# Patient Record
Sex: Male | Born: 1960 | Race: Black or African American | Hispanic: No | Marital: Single | Smoking: Former smoker
Health system: Southern US, Community
[De-identification: ages and names within clinical notes are randomized; demographics above are authoritative.]

## PROBLEM LIST (undated history)

## (undated) DIAGNOSIS — I10 Essential (primary) hypertension: Secondary | ICD-10-CM

## (undated) HISTORY — PX: KNEE SURGERY: SHX244

---

## 2013-11-08 ENCOUNTER — Other Ambulatory Visit (HOSPITAL_COMMUNITY): Payer: Self-pay | Admitting: Internal Medicine

## 2013-11-08 DIAGNOSIS — I998 Other disorder of circulatory system: Secondary | ICD-10-CM

## 2013-11-11 ENCOUNTER — Ambulatory Visit (HOSPITAL_COMMUNITY): Payer: Medicare Other

## 2013-11-15 ENCOUNTER — Ambulatory Visit (HOSPITAL_COMMUNITY): Payer: Medicare Other

## 2013-11-19 ENCOUNTER — Ambulatory Visit (HOSPITAL_COMMUNITY)
Admission: RE | Admit: 2013-11-19 | Discharge: 2013-11-19 | Disposition: A | Discharge status: Home / Self Care | Payer: Medicare Other | Source: Ambulatory Visit | Attending: Internal Medicine | Admitting: Internal Medicine

## 2013-11-19 DIAGNOSIS — M79609 Pain in unspecified limb: Secondary | ICD-10-CM

## 2013-11-19 DIAGNOSIS — I998 Other disorder of circulatory system: Secondary | ICD-10-CM | POA: Diagnosis present

## 2013-11-19 NOTE — Progress Notes (Signed)
VASCULAR LAB PRELIMINARY  PRELIMINARY  PRELIMINARY  PRELIMINARY  Left lower extremity venous duplex completed.    Preliminary report:  Left:  No evidence of DVT, superficial thrombosis, or Baker's cyst.  Johnna Bollier, RVT 11/19/2013, 12:23 PM

## 2014-04-10 ENCOUNTER — Emergency Department (HOSPITAL_COMMUNITY)
Admission: EM | Admit: 2014-04-10 | Discharge: 2014-04-10 | Disposition: A | Discharge status: Home / Self Care | Payer: Medicare Other | Attending: Emergency Medicine | Admitting: Emergency Medicine

## 2014-04-10 ENCOUNTER — Emergency Department (HOSPITAL_COMMUNITY): Payer: Medicare Other

## 2014-04-10 ENCOUNTER — Encounter (HOSPITAL_COMMUNITY): Payer: Self-pay | Admitting: Emergency Medicine

## 2014-04-10 DIAGNOSIS — R079 Chest pain, unspecified: Secondary | ICD-10-CM

## 2014-04-10 DIAGNOSIS — Z86711 Personal history of pulmonary embolism: Secondary | ICD-10-CM | POA: Insufficient documentation

## 2014-04-10 DIAGNOSIS — I10 Essential (primary) hypertension: Secondary | ICD-10-CM | POA: Diagnosis not present

## 2014-04-10 DIAGNOSIS — Z87891 Personal history of nicotine dependence: Secondary | ICD-10-CM | POA: Diagnosis not present

## 2014-04-10 DIAGNOSIS — R109 Unspecified abdominal pain: Secondary | ICD-10-CM | POA: Diagnosis present

## 2014-04-10 DIAGNOSIS — R739 Hyperglycemia, unspecified: Secondary | ICD-10-CM | POA: Insufficient documentation

## 2014-04-10 DIAGNOSIS — R2240 Localized swelling, mass and lump, unspecified lower limb: Secondary | ICD-10-CM | POA: Diagnosis not present

## 2014-04-10 DIAGNOSIS — R0602 Shortness of breath: Secondary | ICD-10-CM | POA: Insufficient documentation

## 2014-04-10 HISTORY — DX: Essential (primary) hypertension: I10

## 2014-04-10 LAB — URINALYSIS, ROUTINE W REFLEX MICROSCOPIC
Bilirubin Urine: NEGATIVE
Glucose, UA: >1000 mg/dL — AB
Hgb urine dipstick: NEGATIVE
Ketones, ur: NEGATIVE mg/dL
LEUKOCYTES UA: NEGATIVE
Nitrite: NEGATIVE
PH: 5.5 (ref 5.0–8.0)
PROTEIN: NEGATIVE mg/dL
Specific Gravity, Urine: 1.038 — ABNORMAL HIGH (ref 1.005–1.030)
Urobilinogen, UA: 0.2 mg/dL (ref 0.0–1.0)

## 2014-04-10 LAB — CBC WITH DIFFERENTIAL/PLATELET
Basophils Absolute: 0 10*3/uL (ref 0.0–0.1)
Basophils Relative: 1 % (ref 0–1)
EOS ABS: 0.1 10*3/uL (ref 0.0–0.7)
EOS PCT: 3 % (ref 0–5)
HCT: 42.4 % (ref 39.0–52.0)
Hemoglobin: 15.2 g/dL (ref 13.0–17.0)
LYMPHS ABS: 2 10*3/uL (ref 0.7–4.0)
LYMPHS PCT: 44 % (ref 12–46)
MCH: 31.9 pg (ref 26.0–34.0)
MCHC: 35.8 g/dL (ref 30.0–36.0)
MCV: 88.9 fL (ref 78.0–100.0)
Monocytes Absolute: 0.5 10*3/uL (ref 0.1–1.0)
Monocytes Relative: 11 % (ref 3–12)
NEUTROS PCT: 41 % — AB (ref 43–77)
Neutro Abs: 1.8 10*3/uL (ref 1.7–7.7)
PLATELETS: 180 10*3/uL (ref 150–400)
RBC: 4.77 MIL/uL (ref 4.22–5.81)
RDW: 13.2 % (ref 11.5–15.5)
WBC: 4.3 10*3/uL (ref 4.0–10.5)

## 2014-04-10 LAB — LIPASE, BLOOD: Lipase: 42 U/L (ref 11–59)

## 2014-04-10 LAB — COMPREHENSIVE METABOLIC PANEL
ALK PHOS: 111 U/L (ref 39–117)
ALT: 29 U/L (ref 0–53)
ANION GAP: 12 (ref 5–15)
AST: 26 U/L (ref 0–37)
Albumin: 3.6 g/dL (ref 3.5–5.2)
BILIRUBIN TOTAL: 0.9 mg/dL (ref 0.3–1.2)
BUN: 14 mg/dL (ref 6–23)
CO2: 23 mmol/L (ref 19–32)
Calcium: 8.6 mg/dL (ref 8.4–10.5)
Chloride: 98 mEq/L (ref 96–112)
Creatinine, Ser: 1.34 mg/dL (ref 0.50–1.35)
GFR calc Af Amer: 67 mL/min — ABNORMAL LOW (ref >90)
GFR calc non Af Amer: 58 mL/min — ABNORMAL LOW (ref >90)
Glucose, Bld: 498 mg/dL — ABNORMAL HIGH (ref 70–99)
Potassium: 4 mmol/L (ref 3.5–5.1)
Sodium: 133 mmol/L — ABNORMAL LOW (ref 135–145)
TOTAL PROTEIN: 6.5 g/dL (ref 6.0–8.3)

## 2014-04-10 LAB — URINE MICROSCOPIC-ADD ON

## 2014-04-10 LAB — CBG MONITORING, ED
GLUCOSE-CAPILLARY: 360 mg/dL — AB (ref 70–99)
GLUCOSE-CAPILLARY: 254 mg/dL — AB (ref 70–99)

## 2014-04-10 MED ORDER — SODIUM CHLORIDE 0.9 % IV BOLUS (SEPSIS)
1000.0000 mL | Freq: Once | INTRAVENOUS | Status: AC
  Administered 2014-04-10: 1000 mL via INTRAVENOUS

## 2014-04-10 MED ORDER — INSULIN ASPART 100 UNIT/ML ~~LOC~~ SOLN
6.0000 [IU] | Freq: Once | SUBCUTANEOUS | Status: AC
  Administered 2014-04-10: 6 [IU] via SUBCUTANEOUS
  Filled 2014-04-10: qty 1

## 2014-04-10 MED ORDER — METFORMIN HCL 500 MG PO TABS
500.0000 mg | ORAL_TABLET | Freq: Once | ORAL | Status: AC
  Administered 2014-04-10: 500 mg via ORAL
  Filled 2014-04-10: qty 1

## 2014-04-10 MED ORDER — INSULIN ASPART 100 UNIT/ML ~~LOC~~ SOLN
4.0000 [IU] | Freq: Once | SUBCUTANEOUS | Status: AC
  Administered 2014-04-10: 4 [IU] via SUBCUTANEOUS
  Filled 2014-04-10: qty 1

## 2014-04-10 MED ORDER — METFORMIN HCL 500 MG PO TABS: 500.0000 mg | ORAL_TABLET | Freq: Two times a day (BID) | ORAL | Status: AC

## 2014-04-10 MED ORDER — IOHEXOL 350 MG/ML SOLN
100.0000 mL | Freq: Once | INTRAVENOUS | Status: AC | PRN
  Administered 2014-04-10: 100 mL via INTRAVENOUS

## 2014-04-10 NOTE — ED Notes (Signed)
Pt transported to CT ?

## 2014-04-10 NOTE — ED Notes (Signed)
Patient states "I have had blood clots before and I can feel them moving around.   It hurts when I breathe in around my lung".   Patient states he is on xarelto to help treat.

## 2014-04-10 NOTE — ED Provider Notes (Signed)
CSN: [id number]     Arrival date & time 04/10/14  1010 History   First MD Initiated Contact with Patient 04/10/14 1145     Chief Complaint  Patient presents with  . Abdominal Pain     (Consider location/radiation/quality/duration/timing/severity/associated sxs/prior Treatment) HPI George Braun is a 53 y.o. male with history of PE, hypertension, presents to emergency department complaining of right sided chest pain. Patient states pain started in the right flank/chest 2 days ago. Patient states pain is exactly the same as when he was diagnosed with blood clot several years ago. He states that he has been on Coumadin, but was switched to xarelto 2 months ago. He denies missing any doses of his medications. He states that pain was not improving, so he called his doctor today, and was told to come here. Patient states he did have some shortness of breath 2 days ago, but it is improving. States that pain is improving as well. He denies any injuries. No fever, chills, cough. No other medications have taken for his symptoms. Denies abdominal pain. No nausea or vomiting. No other complaints.  Past Medical History  Diagnosis Date  . Hypertension    Past Surgical History  Procedure Laterality Date  . Knee surgery     No family history on file. History  Substance Use Topics  . Smoking status: Former Smoker    Types: Cigarettes  . Smokeless tobacco: Not on file  . Alcohol Use: Yes     Comment: socially    Review of Systems  Constitutional: Negative for fever and chills.  Respiratory: Positive for shortness of breath. Negative for cough and chest tightness.   Cardiovascular: Positive for chest pain and leg swelling. Negative for palpitations.  Gastrointestinal: Negative for nausea, vomiting, abdominal pain, diarrhea and abdominal distention.  Genitourinary: Negative for dysuria and frequency.  Musculoskeletal: Negative for myalgias, arthralgias, neck pain and neck stiffness.  Skin:  Negative for rash.  Allergic/Immunologic: Negative for immunocompromised state.  Neurological: Negative for dizziness, weakness, light-headedness, numbness and headaches.  All other systems reviewed and are negative.     Allergies  Asa and Celebrex  Home Medications   Prior to Admission medications   Not on File   BP 134/81 mmHg  Pulse 73  Temp(Src) 98.3 F (36.8 C) (Oral)  Resp 18  SpO2 98% Physical Exam  Constitutional: He appears well-developed and well-nourished. No distress.  Morbidly obese  HENT:  Head: Normocephalic and atraumatic.  Eyes: Conjunctivae are normal.  Neck: Neck supple.  Cardiovascular: Normal rate, regular rhythm and normal heart sounds.   Pulmonary/Chest: Effort normal. No respiratory distress. He has no wheezes. He has no rales. He exhibits no tenderness.  Abdominal: Soft. Bowel sounds are normal. He exhibits no distension. There is no tenderness. There is no rebound.  No CVA tenderness  Musculoskeletal: He exhibits no edema.  Neurological: He is alert.  Skin: Skin is warm and dry.  Nursing note and vitals reviewed.   ED Course  Procedures (including critical care time) Labs Review Labs Reviewed  CBC WITH DIFFERENTIAL - Abnormal; Notable for the following:    Neutrophils Relative % 41 (*)    All other components within normal limits  URINALYSIS, ROUTINE W REFLEX MICROSCOPIC - Abnormal; Notable for the following:    Specific Gravity, Urine 1.038 (*)    Glucose, UA >1000 (*)    All other components within normal limits  COMPREHENSIVE METABOLIC PANEL - Abnormal; Notable for the following:    Sodium 133 (*)  Glucose, Bld 498 (*)    GFR calc non Af Amer 58 (*)    GFR calc Af Amer 67 (*)    All other components within normal limits  LIPASE, BLOOD  URINE MICROSCOPIC-ADD ON    Imaging Review No results found.   EKG Interpretation None      MDM   Final diagnoses:  Chest pain  Hyperglycemia    Pt here with right flank/rib pain.  Hx of PE, feels the same. On xarelto. VS normal. Given hx, will get CT angio.   [place] [facility] [place] [facility] established care with Dr. Roderic PalauAuvbere. No Hx in Epic  1:10 PM Pt's glucose is 498. Anion gap 12. No ketones in UA. Will start IV fluids. 6units of insulin ordered. Pt denies hx of hyperglycemia. CT negative for PE   4:53 PM Patient ended up receiving 4 more units of insulin and another liter of fluids. CBG is 254. He continues to feel well. Vital signs normal. Discussed at length with patient importance of diet and exercise in controlling his diabetes. Discussed patient with Dr. Rennis ChrisJacobowitz. Will discharge home on metformin, with close follow-up with Dr. Caesar BookmanAuvebery. Pt agrees to the plan.   Filed Vitals:   04/10/14 1415 04/10/14 1430 04/10/14 1515 04/10/14 1530  BP: 148/73 141/75 145/79 149/82  Pulse: 63 62 62 60  Temp:      TempSrc:      Resp: 13 20 21 16   SpO2: 97% 97% 97% 99%     Lottie Musselatyana A Josph [place], PA-C 04/10/14 1657  Doug SouSam Jacubowitz, MD 04/10/14 1926

## 2014-04-10 NOTE — Discharge Instructions (Signed)
Here CT scan today is normal. Your blood sugar is very high. Watch her diet carefully, exercise 5 times a week. Take metformin as prescribed. Call your doctor on Monday and follow-up as soon as able. Return if any issues    Hyperglycemia Hyperglycemia occurs when the glucose (sugar) in your blood is too high. Hyperglycemia can happen for many reasons, but it most often happens to people who do not know they have diabetes or are not managing their diabetes properly.  CAUSES  Whether you have diabetes or not, there are other causes of hyperglycemia. Hyperglycemia can occur when you have diabetes, but it can also occur in other situations that you might not be as aware of, such as: Diabetes  If you have diabetes and are having problems controlling your blood glucose, hyperglycemia could occur because of some of the following reasons:  Not following your meal plan.  Not taking your diabetes medications or not taking it properly.  Exercising less or doing less activity than you normally do.  Being sick. Pre-diabetes  This cannot be ignored. Before people develop Type 2 diabetes, they almost always have "pre-diabetes." This is when your blood glucose levels are higher than normal, but not yet high enough to be diagnosed as diabetes. Research has shown that some long-term damage to the body, especially the heart and circulatory system, may already be occurring during pre-diabetes. If you take action to manage your blood glucose when you have pre-diabetes, you may delay or prevent Type 2 diabetes from developing. Stress  If you have diabetes, you may be "diet" controlled or on oral medications or insulin to control your diabetes. However, you may find that your blood glucose is higher than usual in the hospital whether you have diabetes or not. This is often referred to as "stress hyperglycemia." Stress can elevate your blood glucose. This happens because of hormones put out by the body during times  of stress. If stress has been the cause of your high blood glucose, it can be followed regularly by your caregiver. That way he/she can make sure your hyperglycemia does not continue to get worse or progress to diabetes. Steroids  Steroids are medications that act on the infection fighting system (immune system) to block inflammation or infection. One side effect can be a rise in blood glucose. Most people can produce enough extra insulin to allow for this rise, but for those who cannot, steroids make blood glucose levels go even higher. It is not unusual for steroid treatments to "uncover" diabetes that is developing. It is not always possible to determine if the hyperglycemia will go away after the steroids are stopped. A special blood test called an A1c is sometimes done to determine if your blood glucose was elevated before the steroids were started. SYMPTOMS  Thirsty.  Frequent urination.  Dry mouth.  Blurred vision.  Tired or fatigue.  Weakness.  Sleepy.  Tingling in feet or leg. DIAGNOSIS  Diagnosis is made by monitoring blood glucose in one or all of the following ways:  A1c test. This is a chemical found in your blood.  Fingerstick blood glucose monitoring.  Laboratory results. TREATMENT  First, knowing the cause of the hyperglycemia is important before the hyperglycemia can be treated. Treatment may include, but is not be limited to:  Education.  Change or adjustment in medications.  Change or adjustment in meal plan.  Treatment for an illness, infection, etc.  More frequent blood glucose monitoring.  Change in exercise plan.  Decreasing  or stopping steroids.  Lifestyle changes. HOME CARE INSTRUCTIONS   Test your blood glucose as directed.  Exercise regularly. Your caregiver will give you instructions about exercise. Pre-diabetes or diabetes which comes on with stress is helped by exercising.  Eat wholesome, balanced meals. Eat often and at regular,  fixed times. Your caregiver or nutritionist will give you a meal plan to guide your sugar intake.  Being at an ideal weight is important. If needed, losing as little as 10 to 15 pounds may help improve blood glucose levels. SEEK MEDICAL CARE IF:   You have questions about medicine, activity, or diet.  You continue to have symptoms (problems such as increased thirst, urination, or weight gain). SEEK IMMEDIATE MEDICAL CARE IF:   You are vomiting or have diarrhea.  Your breath smells fruity.  You are breathing faster or slower.  You are very sleepy or incoherent.  You have numbness, tingling, or pain in your feet or hands.  You have chest pain.  Your symptoms get worse even though you have been following your caregiver's orders.  If you have any other questions or concerns. Document Released: 09/28/2000 Document Revised: 06/27/2011 Document Reviewed: 08/01/2011 [facility] Patient Information 2015 [place], [place]. This information is not intended to replace advice given to you by your health care provider. Make sure you discuss any questions you have with your health care provider.  Diabetes Mellitus and Food It is important for you to manage your blood sugar (glucose) level. Your blood glucose level can be greatly affected by what you eat. Eating healthier foods in the appropriate amounts throughout the day at about the same time each day will help you control your blood glucose level. It can also help slow or prevent worsening of your diabetes mellitus. Healthy eating may even help you improve the level of your blood pressure and reach or maintain a healthy weight.  HOW CAN FOOD AFFECT ME? Carbohydrates Carbohydrates affect your blood glucose level more than any other type of food. Your dietitian will help you determine how many carbohydrates to eat at each meal and teach you how to count carbohydrates. Counting carbohydrates is important to keep your blood glucose at a healthy level,  especially if you are using insulin or taking certain medicines for diabetes mellitus. Alcohol Alcohol can cause sudden decreases in blood glucose (hypoglycemia), especially if you use insulin or take certain medicines for diabetes mellitus. Hypoglycemia can be a life-threatening condition. Symptoms of hypoglycemia (sleepiness, dizziness, and disorientation) are similar to symptoms of having too much alcohol.  If your health care provider has given you approval to drink alcohol, do so in moderation and use the following guidelines:  Women should not have more than one drink per day, and men should not have more than two drinks per day. One drink is equal to:  12 oz of beer.  5 oz of wine.  1 oz of hard liquor.  Do not drink on an empty stomach.  Keep yourself hydrated. Have water, diet soda, or unsweetened iced tea.  Regular soda, juice, and other mixers might contain a lot of carbohydrates and should be counted. WHAT FOODS ARE NOT RECOMMENDED? As you make food choices, it is important to remember that all foods are not the same. Some foods have fewer nutrients per serving than other foods, even though they might have the same number of calories or carbohydrates. It is difficult to get your body what it needs when you eat foods with fewer nutrients. Examples of  foods that you should avoid that are high in calories and carbohydrates but low in nutrients include:  Trans fats (most processed foods list trans fats on the Nutrition Facts label).  Regular soda.  Juice.  Candy.  Sweets, such as cake, pie, doughnuts, and cookies.  Fried foods. WHAT FOODS CAN I EAT? Have nutrient-rich foods, which will nourish your body and keep you healthy. The food you should eat also will depend on several factors, including:  The calories you need.  The medicines you take.  Your weight.  Your blood glucose level.  Your blood pressure level.  Your cholesterol level. You also should eat a  variety of foods, including:  Protein, such as meat, poultry, fish, tofu, nuts, and seeds (lean animal proteins are best).  Fruits.  Vegetables.  Dairy products, such as milk, cheese, and yogurt (low fat is best).  Breads, grains, pasta, cereal, rice, and beans.  Fats such as olive oil, trans fat-free margarine, canola oil, avocado, and olives. DOES EVERYONE WITH DIABETES MELLITUS HAVE THE SAME MEAL PLAN? Because every person with diabetes mellitus is different, there is not one meal plan that works for everyone. It is very important that you meet with a dietitian who will help you create a meal plan that is just right for you. Document Released: 12/30/2004 Document Revised: 04/09/2013 Document Reviewed: 03/01/2013 [facility] Patient Information 2015 [place], [place]. This information is not intended to replace advice given to you by your health care provider. Make sure you discuss any questions you have with your health care provider.

## 2014-04-10 NOTE — ED Provider Notes (Signed)
Complains of right-sided chest pain gradual onset, pleuritic onset 2 days ago. Pain improved presently. He also complains of polyuria and polydipsia for one month. Patient unaware of diabetes. Presently patient in no distress lungs clear auscultation abdomen morbidly obese, nontender extremities without edema skin warm dry. Assessment/ Plan glycemic control. No evidence of pulmonary embolism  Doug SouSam [place], MD 04/10/14 1558

## 2014-04-10 NOTE — ED Notes (Signed)
Pt from home with c/o pain with inspiration on right side, pt also reports increased left calf pain that started on Tuesday. Pt with hx of blood clots in lung, currently takes xarelto. NAD, lung sounds clear. Only reports pain with inspiration and calf tender to touch.

## 2015-05-20 ENCOUNTER — Encounter (HOSPITAL_COMMUNITY): Payer: Self-pay | Admitting: Emergency Medicine

## 2015-05-20 ENCOUNTER — Emergency Department (HOSPITAL_COMMUNITY)
Admission: EM | Admit: 2015-05-20 | Discharge: 2015-05-20 | Disposition: A | Discharge status: Home / Self Care | Payer: Medicare Other | Attending: Emergency Medicine | Admitting: Emergency Medicine

## 2015-05-20 ENCOUNTER — Emergency Department (HOSPITAL_COMMUNITY): Payer: Medicare Other

## 2015-05-20 DIAGNOSIS — W1839XA Other fall on same level, initial encounter: Secondary | ICD-10-CM | POA: Insufficient documentation

## 2015-05-20 DIAGNOSIS — S79911A Unspecified injury of right hip, initial encounter: Secondary | ICD-10-CM | POA: Insufficient documentation

## 2015-05-20 DIAGNOSIS — Z87891 Personal history of nicotine dependence: Secondary | ICD-10-CM | POA: Diagnosis not present

## 2015-05-20 DIAGNOSIS — I10 Essential (primary) hypertension: Secondary | ICD-10-CM | POA: Diagnosis not present

## 2015-05-20 DIAGNOSIS — Z79899 Other long term (current) drug therapy: Secondary | ICD-10-CM | POA: Diagnosis not present

## 2015-05-20 DIAGNOSIS — Y998 Other external cause status: Secondary | ICD-10-CM | POA: Diagnosis not present

## 2015-05-20 DIAGNOSIS — Z7984 Long term (current) use of oral hypoglycemic drugs: Secondary | ICD-10-CM | POA: Diagnosis not present

## 2015-05-20 DIAGNOSIS — W19XXXA Unspecified fall, initial encounter: Secondary | ICD-10-CM

## 2015-05-20 DIAGNOSIS — Y92002 Bathroom of unspecified non-institutional (private) residence single-family (private) house as the place of occurrence of the external cause: Secondary | ICD-10-CM | POA: Diagnosis not present

## 2015-05-20 DIAGNOSIS — Y9389 Activity, other specified: Secondary | ICD-10-CM | POA: Insufficient documentation

## 2015-05-20 LAB — BASIC METABOLIC PANEL
Anion gap: 14 (ref 5–15)
BUN: 15 mg/dL (ref 6–20)
CALCIUM: 9.1 mg/dL (ref 8.9–10.3)
CO2: 22 mmol/L (ref 22–32)
CREATININE: 1.62 mg/dL — AB (ref 0.61–1.24)
Chloride: 106 mmol/L (ref 101–111)
GFR calc non Af Amer: 46 mL/min — ABNORMAL LOW (ref >60)
GFR, EST AFRICAN AMERICAN: 53 mL/min — AB (ref >60)
Glucose, Bld: 106 mg/dL — ABNORMAL HIGH (ref 65–99)
Potassium: 3.7 mmol/L (ref 3.5–5.1)
Sodium: 142 mmol/L (ref 135–145)

## 2015-05-20 LAB — URINALYSIS, ROUTINE W REFLEX MICROSCOPIC
BILIRUBIN URINE: NEGATIVE
Glucose, UA: >1000 mg/dL — AB
HGB URINE DIPSTICK: NEGATIVE
Ketones, ur: NEGATIVE mg/dL
Leukocytes, UA: NEGATIVE
Nitrite: NEGATIVE
Protein, ur: 30 mg/dL — AB
Specific Gravity, Urine: 1.03 (ref 1.005–1.030)
pH: 5.5 (ref 5.0–8.0)

## 2015-05-20 LAB — CBC
HCT: 50.8 % (ref 39.0–52.0)
Hemoglobin: 18.1 g/dL — ABNORMAL HIGH (ref 13.0–17.0)
MCH: 32.4 pg (ref 26.0–34.0)
MCHC: 35.6 g/dL (ref 30.0–36.0)
MCV: 90.9 fL (ref 78.0–100.0)
Platelets: 209 10*3/uL (ref 150–400)
RBC: 5.59 MIL/uL (ref 4.22–5.81)
RDW: 13.5 % (ref 11.5–15.5)
WBC: 7.2 10*3/uL (ref 4.0–10.5)

## 2015-05-20 LAB — URINE MICROSCOPIC-ADD ON

## 2015-05-20 LAB — CBG MONITORING, ED: Glucose-Capillary: 105 mg/dL — ABNORMAL HIGH (ref 65–99)

## 2015-05-20 MED ORDER — HYDROCODONE-ACETAMINOPHEN 5-325 MG PO TABS: 1.0000 | ORAL_TABLET | Freq: Four times a day (QID) | ORAL | Status: AC | PRN

## 2015-05-20 MED ORDER — MORPHINE SULFATE (PF) 4 MG/ML IV SOLN
4.0000 mg | Freq: Once | INTRAVENOUS | Status: AC
  Administered 2015-05-20: 4 mg via INTRAMUSCULAR
  Filled 2015-05-20: qty 1

## 2015-05-20 NOTE — ED Notes (Signed)
Patient d/c'd self care.  F/U and medications discussed.  Patient verbalized understanding. 

## 2015-05-20 NOTE — ED Provider Notes (Signed)
CSN: [id number]     Arrival date & time 05/20/15  1159 History   First MD Initiated Contact with Patient 05/20/15 1506     Chief Complaint  Patient presents with  . Fall    right  . Hip Pain    HPI  Patient presents with concern of ongoing pain following a fall. Yesterday, the patient had a mechanical fall in the bathroom. Since that time he said pain persistently throughout the right side, primarily in the right superior hip. He has been ambulatory, though not without substantial pain. He notes that prior to the fall he had some sense of feeling generally unwell, but no focal pain, lightheadedness, syncope, chest pain. Since the fall, no relief with OTC medication or tramadol. No distal dysesthesia or weakness. No urinary complaints. No dyspnea. The patient is sore, severe, radiating throughout the back, superiorly and laterally.  Past Medical History  Diagnosis Date  . Hypertension    Past Surgical History  Procedure Laterality Date  . Knee surgery     No family history on file. Social History  Substance Use Topics  . Smoking status: Former Smoker    Types: Cigarettes  . Smokeless tobacco: None  . Alcohol Use: Yes     Comment: socially    Review of Systems  Constitutional:       Per HPI, otherwise negative  HENT:       Per HPI, otherwise negative  Respiratory:       Per HPI, otherwise negative  Cardiovascular:       Per HPI, otherwise negative  Gastrointestinal: Negative for vomiting.  Endocrine:       Negative aside from HPI  Genitourinary:       Neg aside from HPI   Musculoskeletal:       Per HPI, otherwise negative  Skin: Negative for color change.  Neurological: Negative for syncope.      Allergies  Asa; Celebrex; and Valium  Home Medications   Prior to Admission medications   Medication Sig Start Date End Date Taking? Authorizing Provider  atorvastatin (LIPITOR) 10 MG tablet Take 10 mg by mouth daily.  03/15/14  Yes Historical Provider, MD    cetirizine (ZYRTEC) 10 MG tablet Take 10 mg by mouth daily as needed for allergies.  05/13/15   Historical Provider, MD  cloNIDine (CATAPRES) 0.2 MG tablet Take 0.2 mg by mouth 2 (two) times daily.  03/18/14  Yes Historical Provider, MD  enalapril (VASOTEC) 20 MG tablet Take 20 mg by mouth 2 (two) times daily.  03/14/14  Yes Historical Provider, MD  furosemide (LASIX) 20 MG tablet Take 20 mg by mouth at bedtime.  03/17/14  Yes Historical Provider, MD  INVOKANA 300 MG TABS tablet Take 150 mg by mouth 2 (two) times daily.  05/11/15  Yes Historical Provider, MD  metFORMIN (GLUCOPHAGE) 500 MG tablet Take 1 tablet (500 mg total) by mouth 2 (two) times daily with a meal. 04/10/14  Yes Tatyana Kirichenko, PA-C  metoprolol succinate (TOPROL-XL) 50 MG 24 hr tablet Take 50 mg by mouth 2 (two) times daily.  03/15/14  Yes Historical Provider, MD  phentermine (ADIPEX-P) 37.5 MG tablet Take 37.5 mg by mouth every morning. 05/13/15  Yes Historical Provider, MD  polyethylene glycol powder (GLYCOLAX/MIRALAX) powder MIX ONE tablespoonfull in EIGHT OUNCE OF WATER/ JUICE DAILY 05/04/15  Yes Historical Provider, MD  PROAIR HFA 108 (90 BASE) MCG/ACT inhaler Inhale 1 puff into the lungs every 6 (six) hours as needed for wheezing  or shortness of breath.  02/21/14   Historical Provider, MD  tiZANidine (ZANAFLEX) 4 MG tablet Take 4 mg by mouth 2 (two) times daily as needed for muscle spasms.  05/04/15  Yes Historical Provider, MD  traMADol (ULTRAM) 50 MG tablet Take 50 mg by mouth every 6 (six) hours as needed (pain).  03/18/14  Yes Historical Provider, MD  traZODone (DESYREL) 100 MG tablet Take 100 mg by mouth at bedtime.  05/04/15  Yes Historical Provider, MD  triamcinolone cream (KENALOG) 0.5 % Apply 1 application topically 2 (two) times daily as needed (inflammation).  02/21/14   Historical Provider, MD  VOLTAREN 1 % GEL Apply 2 g topically 4 (four) times daily as needed (pain).  03/25/14  Yes Historical Provider, MD  XARELTO 20 MG  TABS tablet Take 20 mg by mouth daily.  03/21/14  Yes Historical Provider, MD  zolpidem (AMBIEN) 10 MG tablet Take 10 mg by mouth at bedtime. 05/16/15  Yes Historical Provider, MD   BP 163/108 mmHg  Pulse 87  Temp(Src) 98.2 F (36.8 C) (Oral)  Resp 18  SpO2 100% Physical Exam  Constitutional: He is oriented to person, place, and time. He appears well-developed. No distress.  HENT:  Head: Normocephalic and atraumatic.  Eyes: Conjunctivae and EOM are normal.  Cardiovascular: Normal rate and regular rhythm.   Pulmonary/Chest: Effort normal. No stridor. No respiratory distress.  Abdominal: He exhibits no distension.  Musculoskeletal: He exhibits no edema.       Right shoulder: Normal.       Left shoulder: Normal.       Left hip: Normal.       Right knee: Normal.       Legs: Neurological: He is alert and oriented to person, place, and time.  Skin: Skin is warm and dry.  Psychiatric: He has a normal mood and affect.  Nursing note and vitals reviewed.   ED Course  Procedures (including critical care time) Labs Review Labs Reviewed  CBG MONITORING, ED - Abnormal; Notable for the following:    Glucose-Capillary 105 (*)    All other components within normal limits  BASIC METABOLIC PANEL  CBC  URINALYSIS, ROUTINE W REFLEX MICROSCOPIC (NOT AT [facility])    Imaging Review Ct Pelvis Wo Contrast  05/20/2015  CLINICAL DATA:  Fall last night landing on the right side. Right hip pain. Painful ambulation. EXAM: CT PELVIS WITHOUT CONTRAST TECHNIQUE: Multidetector CT imaging of the pelvis was performed following the standard protocol without intravenous contrast. COMPARISON:  05/20/2015 FINDINGS: Prominent bridging spurring of both sacroiliac joints. Considerable facet spurring at L4-5 and L5-S1 causing moderate to prominent right foraminal impingement at L4-5 and mild left foraminal impingement at L4-5. Transitional L5 vertebra. Scattered spurring along the ischial tuberosities, acetabula, and iliac  crests, possibly from diffuse idiopathic skeletal hyperostosis. There chronic well corticated bony fragments along the somewhat irregularly spurred pubic pubic bones. I do not see an acute bony finding. No hip effusion or regional bursitis observed. No significant regional muscular asymmetry IMPRESSION: 1. Extensive spurring suggesting diffuse idiopathic skeletal hyperostosis. No acute bony findings. 2. 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 This assumes that the transitional vertebra is labeled L5. Electronically Signed   By: Gaylyn Rong M.D.   On: 05/20/2015 18:04      Dg Hip Unilat With Pelvis 2-3 Views Right  05/20/2015  CLINICAL DATA:  Fall.  Right hip pain EXAM: DG HIP (WITH OR WITHOUT PELVIS) 2-3V RIGHT COMPARISON:  None. FINDINGS: There  is no evidence of hip fracture or dislocation. There is no evidence of arthropathy or other focal bone abnormality. IMPRESSION: Negative. If there is high clinical suspicion for occult fracture or the patient refuses to weightbear, consider further evaluation with MRI. Although CT is expeditious, evidence is lacking regarding accuracy of CT over plain film radiography. Electronically Signed   By: Signa Kell M.D.   On: 05/20/2015 16:14   I have personally reviewed and evaluated these images and lab results as part of my medical decision-making.   EKG Interpretation   Date/Time:  Wednesday May 20 2015 15:11:16 EST Ventricular Rate:  80 PR Interval:    QRS Duration: 193 QT Interval:  460 QTC Calculation: 531 R Axis:   6 Text Interpretation:  Atrial flutter ABNORMAL  6:58 PM Patient appears calm, vital signs unremarkable. No new complaint sprayed Discussed all findings, reassuring x-ray, CT.  MDM  Patient presents after a fall with pain throughout the right side, predominantly in the hip. Here he is awake, alert, neurologically intact, hemodynamically stable. Patient had improvement in his condition here with  narcotic analgesia. No x-ray, CT evidence for fracture. Labs largely reassuring, aside from mild elevation in creatinine.  Patient d/c in stable condition to f/u w ortho.  Gerhard Munch, MD 05/20/15 1900

## 2015-05-20 NOTE — ED Notes (Signed)
MD aware of BP.  Patient takes medications for BP.  Patient D/C'd self care.

## 2015-05-20 NOTE — ED Notes (Signed)
Per [facility] pt from home, fell last night, no head injury, no loc, fell and landed on right side, right hip pain and right thoracic pain. Pt has been ambulatory today yet with pain. Per ems pt has irregular heart rate, A fib at 78 HR. Alert and oriented x 4.

## 2015-05-20 NOTE — Discharge Instructions (Signed)
As discussed, your evaluation today has been largely reassuring.  But, it is important that you monitor your condition carefully, and do not hesitate to return to the ED if you develop new, or concerning changes in your condition. ? ?Otherwise, please follow-up with your physician for appropriate ongoing care. ? ?

## 2015-05-20 NOTE — ED Notes (Signed)
He remains awake, and is pleased the doctor is pursuing CT.

## 2015-05-20 NOTE — ED Notes (Signed)
UNABLE TO COLLECT LABS AT THIS TIME PATIENT GOING TO XRAY 

## 2016-06-19 IMAGING — CT CT PELVIS W/O CM
2 of 3 series · 16 of 46 positions shown, 18 images · non-contrast
Comparison: 05/20/2015

CLINICAL DATA: Fall last night landing on the right side. Right hip
pain. Painful ambulation.

EXAM:
CT PELVIS WITHOUT CONTRAST
TECHNIQUE: Multidetector CT imaging of the pelvis was performed following the
standard protocol without intravenous contrast.

[Series 3: pelvis st · axial · 0.71mm/px · z∈[+772,+1012]mm · 13 of 56 slices shown, 15 images]
[im 4/56  soft-tissue]
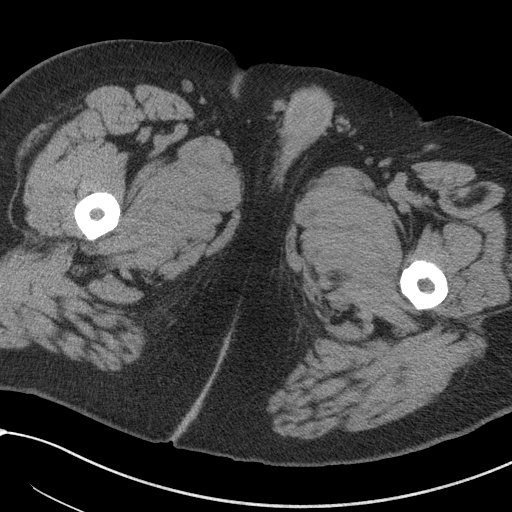
[im 4/56  bone]
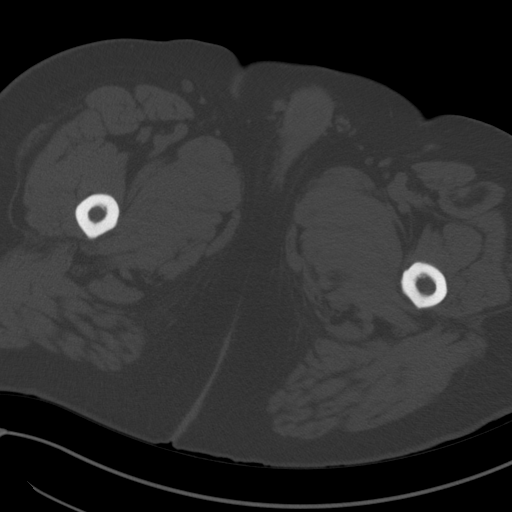
[im 8/56  soft-tissue]
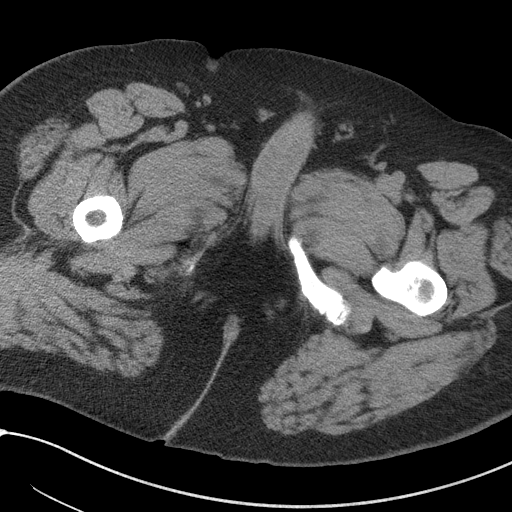
[im 11/56  soft-tissue]
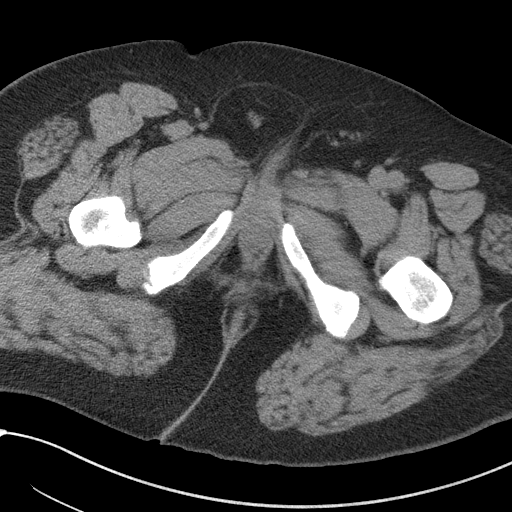
[im 16/56  soft-tissue]
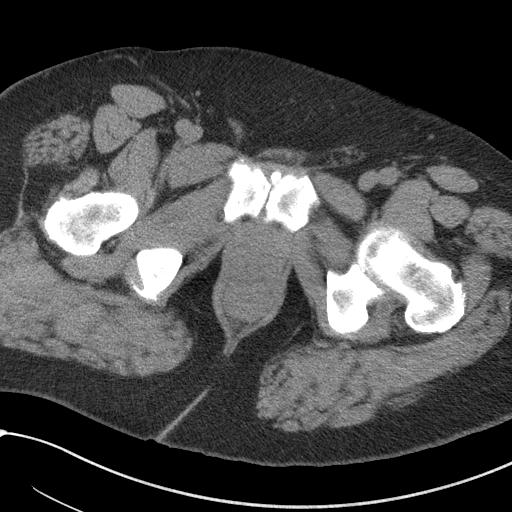
[im 20/56  soft-tissue]
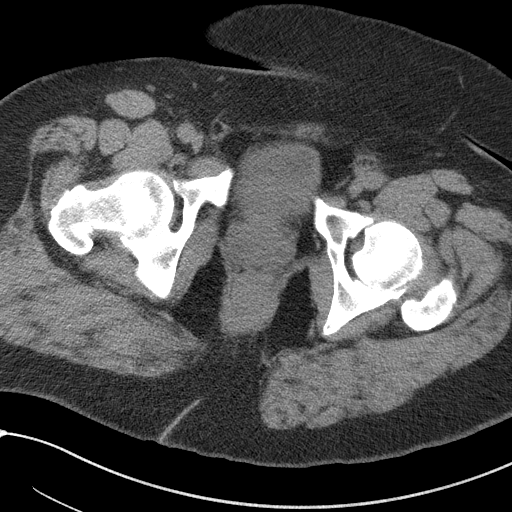
[im 24/56  soft-tissue]
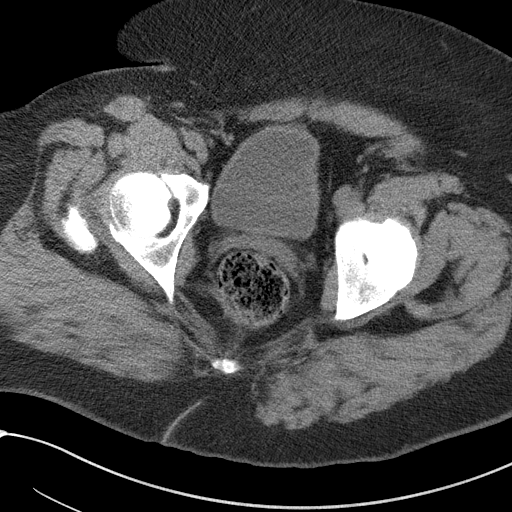
[im 29/56  soft-tissue]
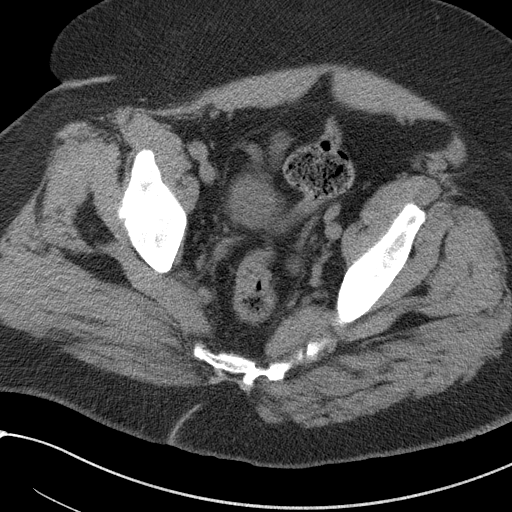
[im 32/56  soft-tissue]
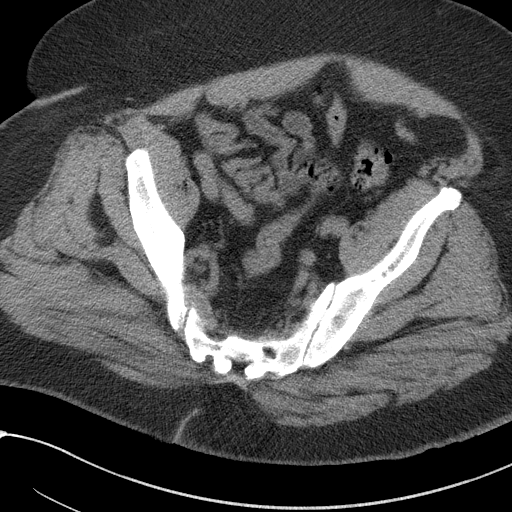
[im 36/56  soft-tissue]
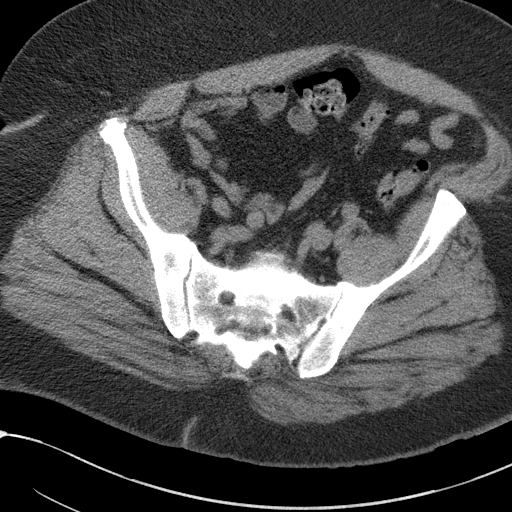
[im 36/56  bone]
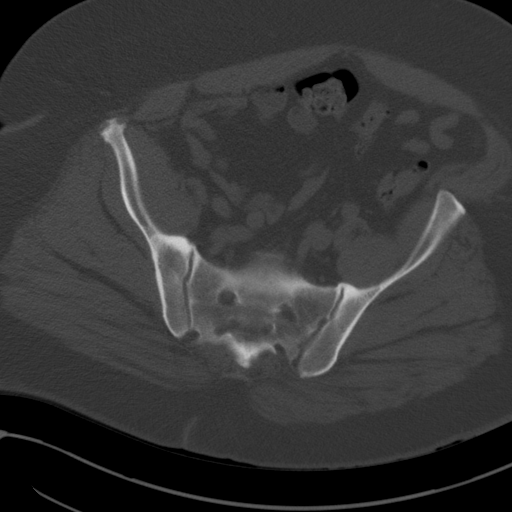
[im 40/56  soft-tissue]
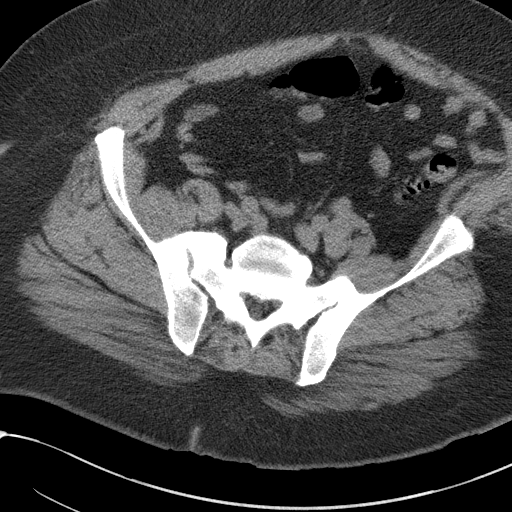
[im 45/56  soft-tissue]
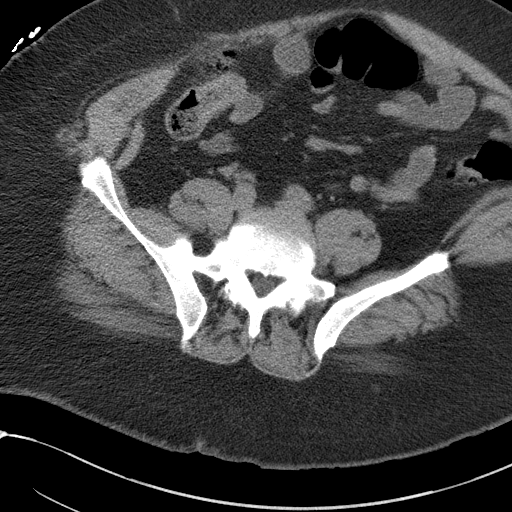
[im 48/56  soft-tissue]
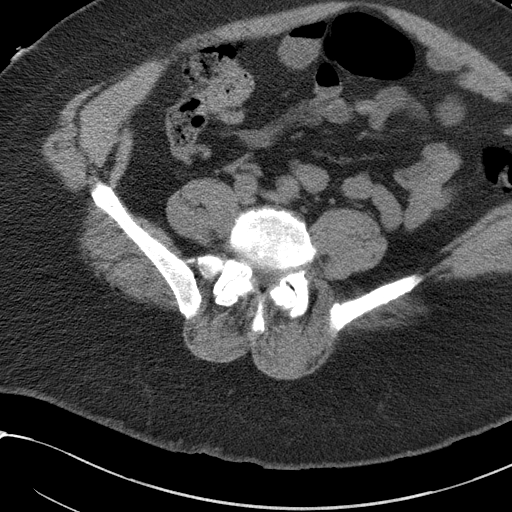
[im 52/56  soft-tissue]
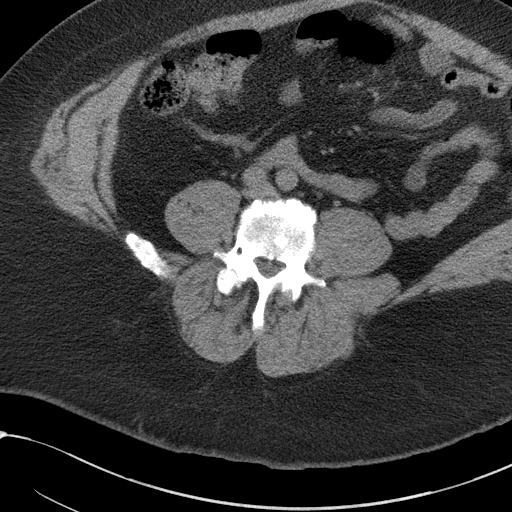

[Series 4: coronal images · coronal · 0.54mm/px · 3 of 188 slices shown]
[im 63/188  soft-tissue]
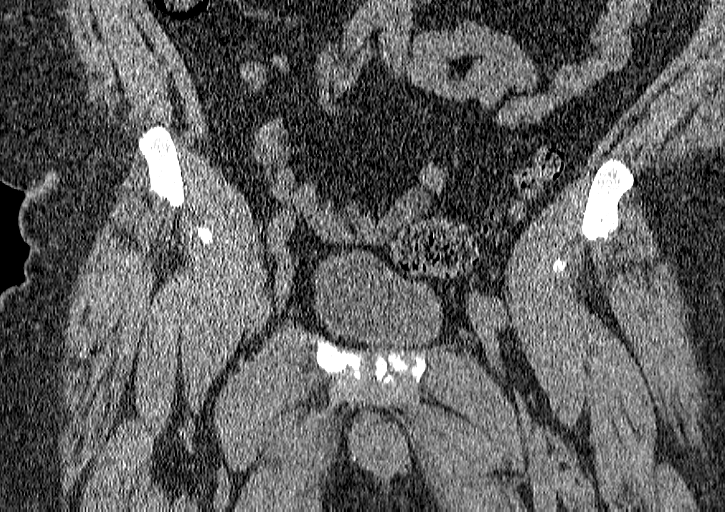
[im 84/188  soft-tissue]
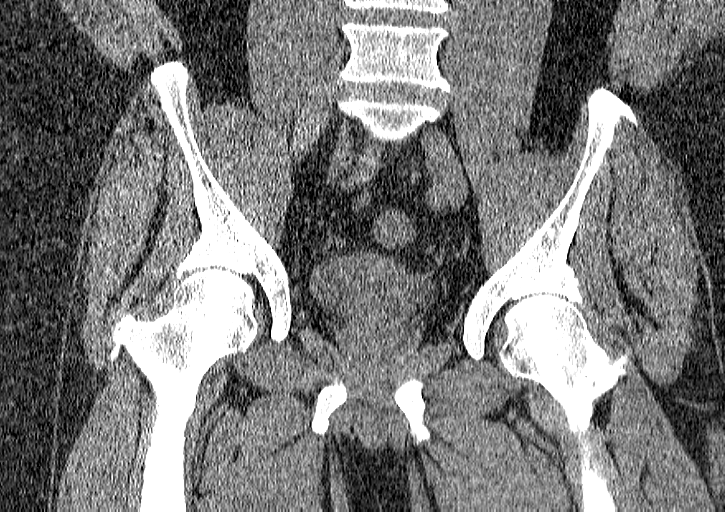
[im 104/188  soft-tissue]
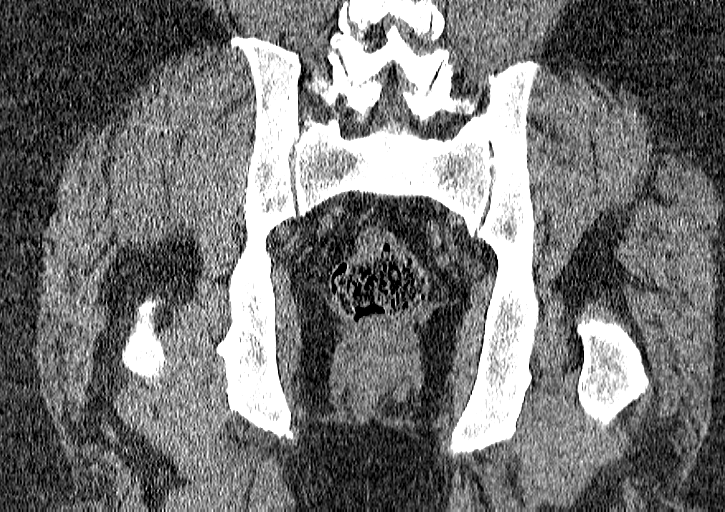

[16 of 46 positions shown; findings below may reference images not displayed]

FINDINGS: Prominent bridging spurring of both sacroiliac joints. Considerable
facet spurring at L4-5 and L5-S1 causing moderate to prominent right
foraminal impingement at L4-5 and mild left foraminal impingement at
L4-5. Transitional L5 vertebra.

Scattered spurring along the ischial tuberosities, acetabula, and
iliac crests, possibly from diffuse idiopathic skeletal
hyperostosis. There chronic well corticated bony fragments along the
somewhat irregularly spurred pubic pubic bones.

I do not see an acute bony finding. No hip effusion or regional
bursitis observed. No significant regional muscular asymmetry
IMPRESSION: 1. Extensive spurring suggesting diffuse idiopathic skeletal
hyperostosis. No acute bony findings.
2. Facet spurring at L4-5 causes bilateral foraminal stenosis. This
assumes that the transitional vertebra is labeled L5.

## 2018-03-18 DEATH — deceased
